# Patient Record
Sex: Male | Born: 2005 | Race: Black or African American | Hispanic: No | Marital: Single | Smoking: Never smoker
Health system: Southern US, Community
[De-identification: ages and names within clinical notes are randomized; demographics above are authoritative.]

## PROBLEM LIST (undated history)

## (undated) DIAGNOSIS — Z9109 Other allergy status, other than to drugs and biological substances: Secondary | ICD-10-CM

---
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
## 2024-10-28 ENCOUNTER — Ambulatory Visit
Admission: EM | Admit: 2024-10-28 | Discharge: 2024-10-28 | Disposition: A | Discharge status: Home / Self Care | Payer: Self-pay | Source: Home / Self Care | Attending: Family Medicine | Admitting: Family Medicine

## 2024-10-28 ENCOUNTER — Encounter: Payer: Self-pay | Admitting: Emergency Medicine

## 2024-10-28 DIAGNOSIS — F0781 Postconcussional syndrome: Secondary | ICD-10-CM

## 2024-10-28 MED ORDER — KETOROLAC TROMETHAMINE 30 MG/ML IJ SOLN
30.0000 mg | Freq: Once | INTRAMUSCULAR | Status: AC
  Administered 2024-10-28: 30 mg via INTRAMUSCULAR

## 2024-10-28 MED ORDER — TIZANIDINE HCL 4 MG PO TABS: 4.0000 mg | ORAL_TABLET | Freq: Three times a day (TID) | ORAL | 0 refills | Status: AC | PRN
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
